# Patient Record
Sex: Male | Born: 1970 | Race: White | Hispanic: No | Marital: Single
Health system: Southern US, Community
[De-identification: ages and names within clinical notes are randomized; demographics above are authoritative.]

---

## 1999-06-14 ENCOUNTER — Emergency Department (HOSPITAL_COMMUNITY): Admission: EM | Admit: 1999-06-14 | Discharge: 1999-06-14 | Payer: Self-pay | Admitting: Emergency Medicine

## 2001-03-30 ENCOUNTER — Emergency Department (HOSPITAL_COMMUNITY): Admission: EM | Admit: 2001-03-30 | Discharge: 2001-03-31 | Payer: Self-pay | Admitting: Emergency Medicine

## 2004-02-12 ENCOUNTER — Emergency Department (HOSPITAL_COMMUNITY): Admission: EM | Admit: 2004-02-12 | Discharge: 2004-02-12 | Payer: Self-pay | Admitting: Emergency Medicine

## 2004-02-26 ENCOUNTER — Emergency Department (HOSPITAL_COMMUNITY): Admission: EM | Admit: 2004-02-26 | Discharge: 2004-02-26 | Payer: Self-pay

## 2005-05-06 ENCOUNTER — Emergency Department (HOSPITAL_COMMUNITY): Admission: EM | Admit: 2005-05-06 | Discharge: 2005-05-06 | Payer: Self-pay | Admitting: Emergency Medicine

## 2006-10-26 IMAGING — CT CT ABDOMEN W/O CM
1 series · 15 of 32 positions shown, 19 images · IV contrast (agent unspecified)
Comparison: none

CLINICAL DATA: Left flank pain and microhematuria.  History of prior kidney stones.  
 ABDOMEN CT WITHOUT CONTRAST:
TECHNIQUE: Multidetector CT imaging of the abdomen was performed following the standard protocol without IV contrast.  
 The scan demonstrates mild left hydronephrosis.  There is a 5 mm stone in the lower pole of the right kidney but there are no discrete left renal calculi.  There is a 15 mm low density lesion in the anterior aspect of the left lobe of the liver, nonspecific.  Remainder of the visualized portion of the liver is normal but the liver is not completely visualized.  The visualized portion of the spleen is normal.  Pancreas and adrenal glands are normal.
TECHNIQUE: Multidetector CT imaging of the pelvis was performed following the standard protocol without IV contrast. 
 The left ureter is dilated to the level of a 3 mm stone in the left ureterovesical junction.  The right ureter appears normal.  There is no free fluid or other significant abnormality in the pelvis.

[Series 3: stone_wo 5.0 b40f st · axial · 0.63mm/px · z∈[-204,+124]mm · 15 of 91 slices shown, 19 images]
[im 6/91  soft-tissue]
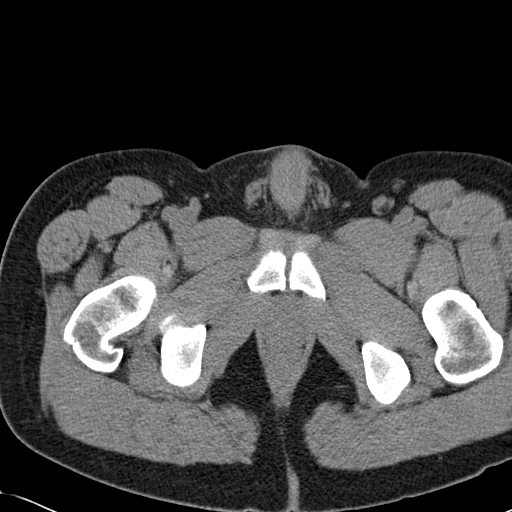
[im 6/91  bone]
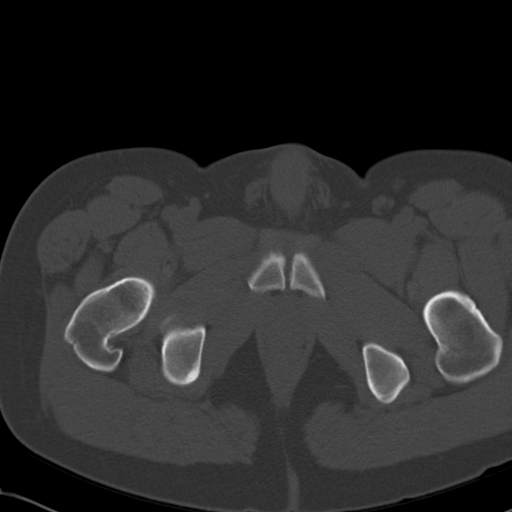
[im 12/91  soft-tissue]
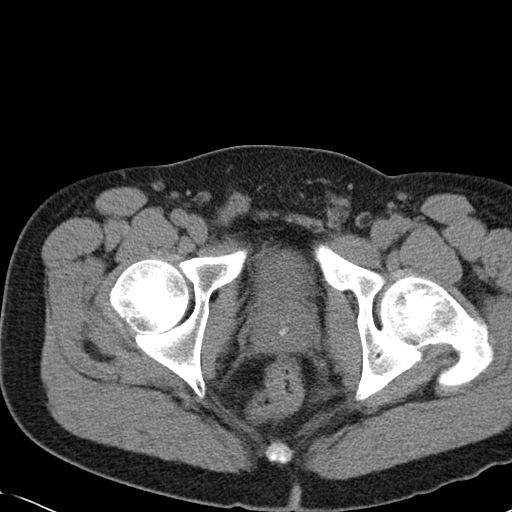
[im 18/91  soft-tissue]
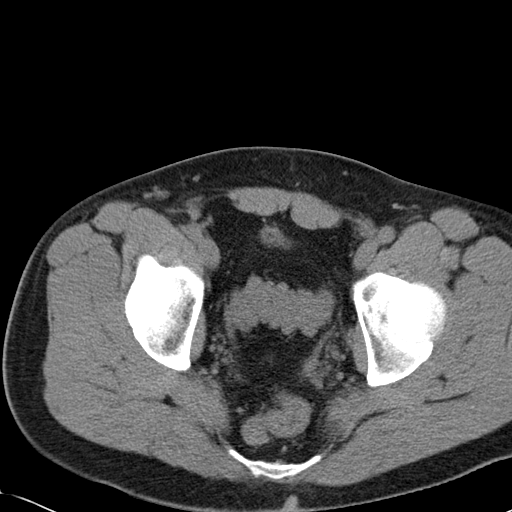
[im 27/91  soft-tissue]
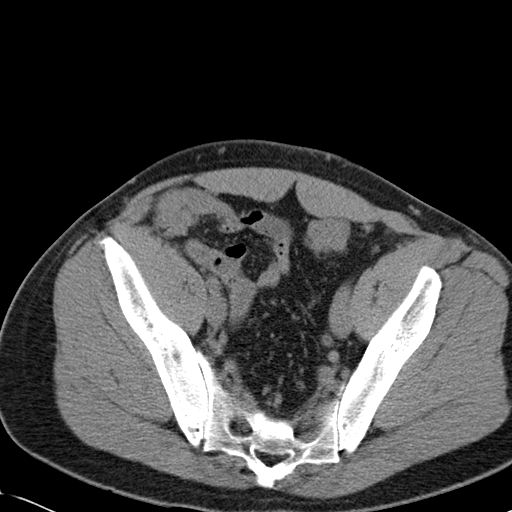
[im 32/91  soft-tissue]
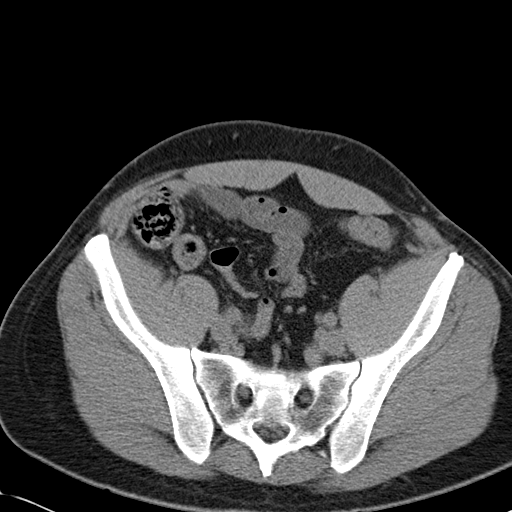
[im 38/91  soft-tissue]
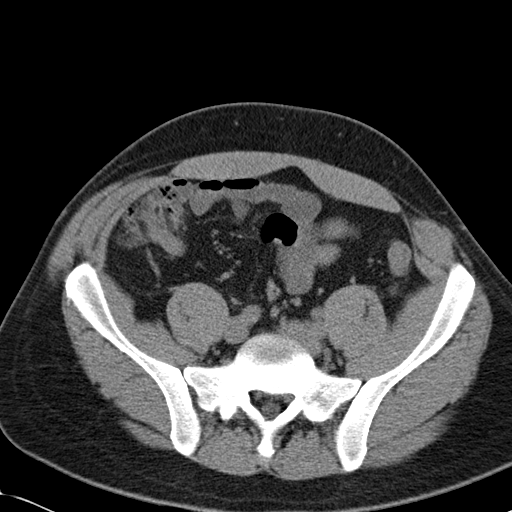
[im 47/91  soft-tissue]
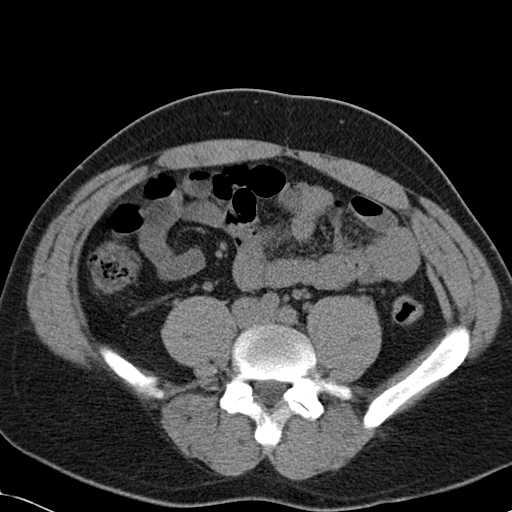
[im 53/91  soft-tissue]
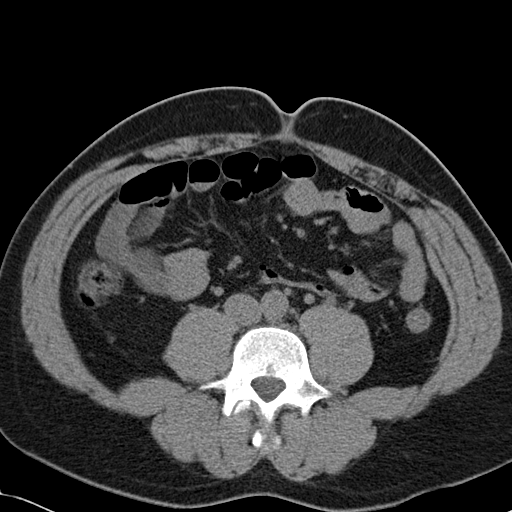
[im 59/91  soft-tissue]
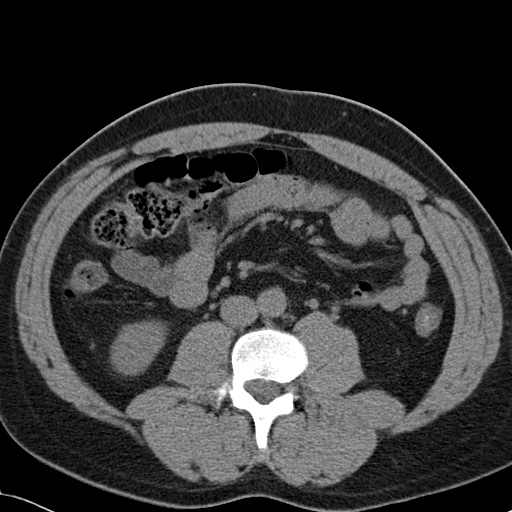
[im 59/91  bone]
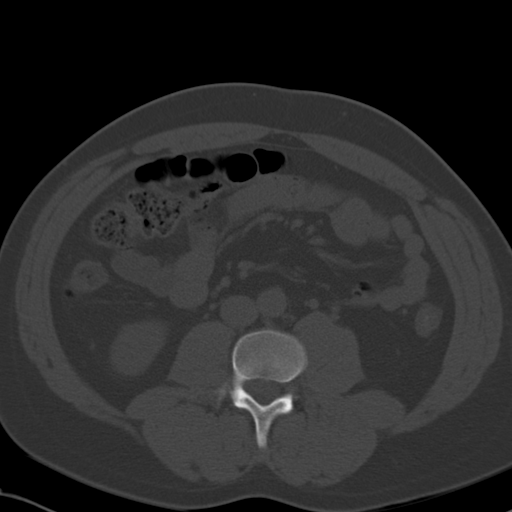
[im 64/91  soft-tissue]
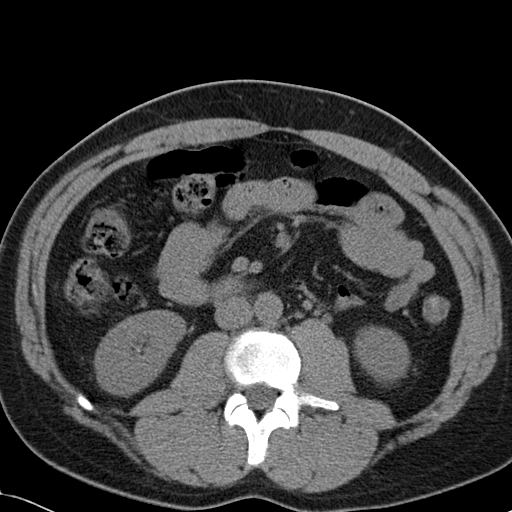
[im 73/91  soft-tissue]
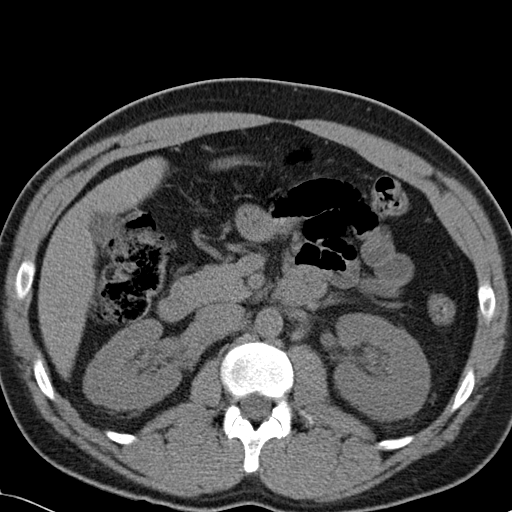
[im 79/91  soft-tissue]
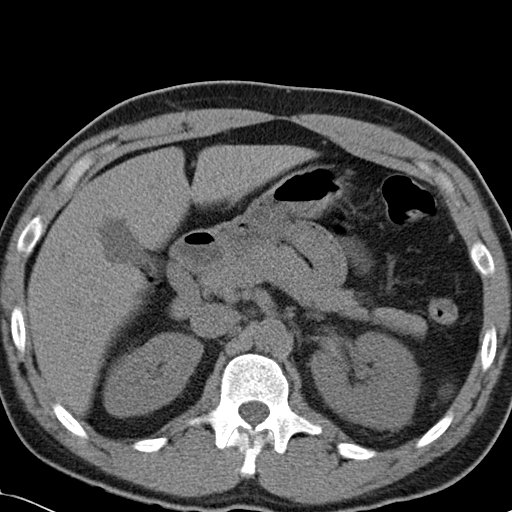
[im 79/91  lung]
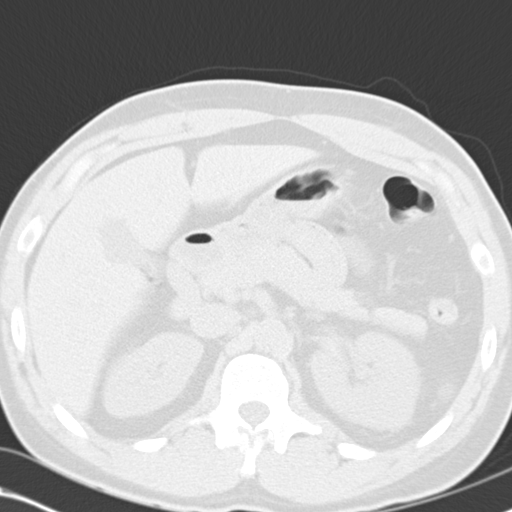
[im 82/91  lung]
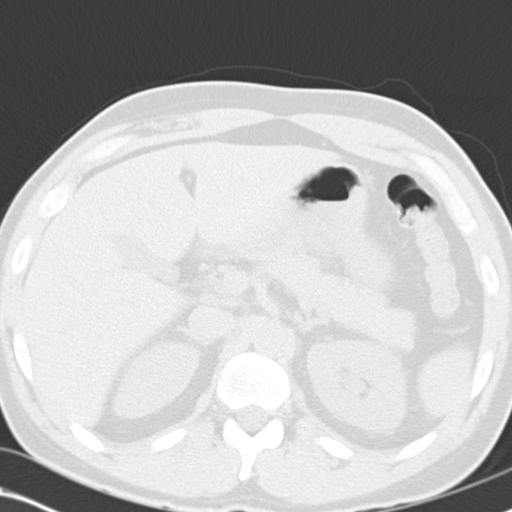
[im 85/91  soft-tissue]
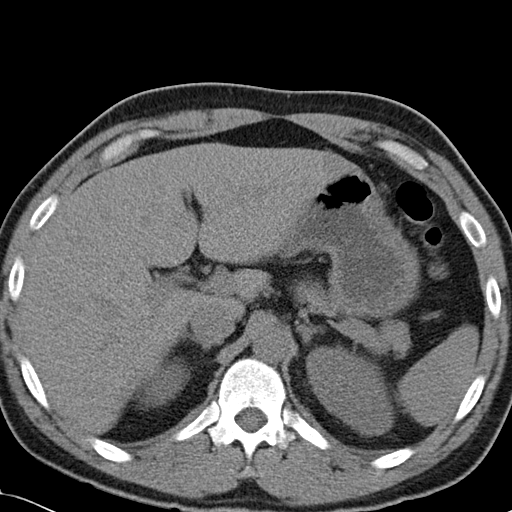
[im 85/91  lung]
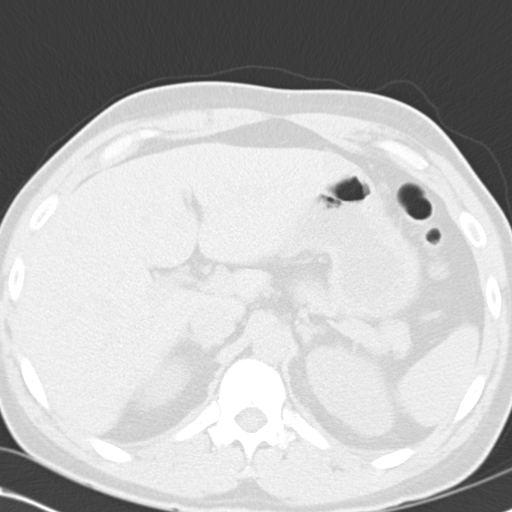
[im 88/91  lung]
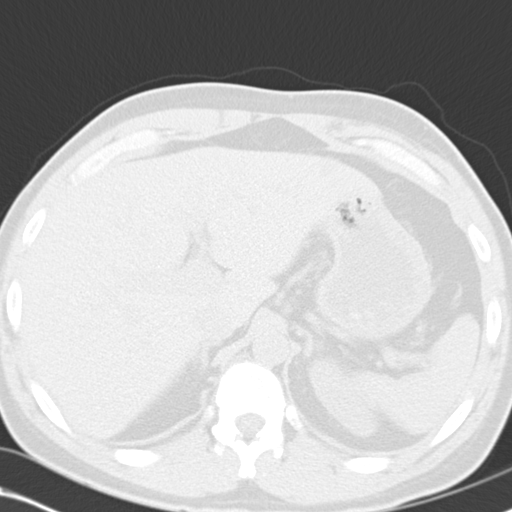

[15 of 32 positions shown; findings below may reference images not displayed]

IMPRESSION: 1.  Mild left hydronephrosis.  
 2.  Small stone in the lower pole of the right kidney.  
 3.  15 mm nonspecific rounded lesion in the left lobe of the liver.  Statistically, this represents a cyst or hemangioma but the appearance on this noncontrasted CT scan is nonspecific.  Limited ultrasound may be useful for further evaluation of this finding.  
 PELVIS CT WITHOUT CONTRAST:
IMPRESSION: 3 mm stone in the left ureterovesical junction causing mild dilatation of the left ureter.

## 2015-11-27 ENCOUNTER — Telehealth: Payer: Self-pay | Admitting: Internal Medicine

## 2015-11-27 NOTE — Telephone Encounter (Signed)
Patient's INR was 7 and a message was left on the work phone since patient was not available on other phone numbers. Message was left on work phone and the documentation is forwarded to Dr. Dietrich Pates to follow up with the patient. Unable to leave message on the other phone numbers.  Called back the labs and told them that I was unable to get in touch with the patient and Shanda Bumps was told to call back during the day time so that again an attempt be made to contact the patient since he is not responding to phone at this hour. She said that she will let the oncall person know to contact again during the day time after 8 AM.
